# Patient Record
Sex: Female | Born: 1970 | Race: Black or African American | Hispanic: No | Smoking: Never smoker
Health system: Southern US, Community
[De-identification: ages and names within clinical notes are randomized; demographics above are authoritative.]

## PROBLEM LIST (undated history)

## (undated) DIAGNOSIS — N838 Other noninflammatory disorders of ovary, fallopian tube and broad ligament: Secondary | ICD-10-CM

## (undated) HISTORY — DX: Other noninflammatory disorders of ovary, fallopian tube and broad ligament: N83.8

## (undated) HISTORY — PX: TUBAL LIGATION: SHX77

---

## 2002-05-12 ENCOUNTER — Ambulatory Visit (HOSPITAL_COMMUNITY): Admission: RE | Admit: 2002-05-12 | Discharge: 2002-05-12 | Payer: Self-pay | Admitting: Internal Medicine

## 2003-05-02 ENCOUNTER — Emergency Department (HOSPITAL_COMMUNITY): Admission: EM | Admit: 2003-05-02 | Discharge: 2003-05-02 | Payer: Self-pay | Admitting: Emergency Medicine

## 2004-05-05 ENCOUNTER — Ambulatory Visit (HOSPITAL_COMMUNITY): Admission: RE | Admit: 2004-05-05 | Discharge: 2004-05-05 | Payer: Self-pay | Admitting: Family Medicine

## 2006-07-27 ENCOUNTER — Ambulatory Visit (HOSPITAL_COMMUNITY): Admission: RE | Admit: 2006-07-27 | Discharge: 2006-07-27 | Payer: Self-pay | Admitting: Family Medicine

## 2006-08-23 ENCOUNTER — Ambulatory Visit (HOSPITAL_COMMUNITY): Admission: RE | Admit: 2006-08-23 | Discharge: 2006-08-23 | Payer: Self-pay | Admitting: Obstetrics and Gynecology

## 2007-08-29 ENCOUNTER — Ambulatory Visit (HOSPITAL_COMMUNITY): Admission: RE | Admit: 2007-08-29 | Discharge: 2007-08-29 | Payer: Self-pay | Admitting: Family Medicine

## 2009-04-26 ENCOUNTER — Ambulatory Visit (HOSPITAL_COMMUNITY): Admission: RE | Admit: 2009-04-26 | Discharge: 2009-04-26 | Payer: Self-pay | Admitting: Family Medicine

## 2009-07-05 ENCOUNTER — Other Ambulatory Visit: Admission: RE | Admit: 2009-07-05 | Discharge: 2009-07-05 | Payer: Self-pay | Admitting: Obstetrics and Gynecology

## 2010-05-04 ENCOUNTER — Encounter: Payer: Self-pay | Admitting: Family Medicine

## 2010-08-29 NOTE — Op Note (Signed)
NAME:  [name] [name], [name] [name]                           ACCOUNT NO.:  [id number]   MEDICAL RECORD NO.:  [id number]                   PATIENT TYPE:  AMB   LOCATION:  DAY                                  FACILITY:  [facility]   PHYSICIAN:  Lionel December, [name].D.                 DATE OF BIRTH:  [birth year]   DATE OF PROCEDURE:  05/12/2002  DATE OF DISCHARGE:                                 OPERATIVE REPORT   PROCEDURE:  Total colonoscopy.   INDICATIONS FOR PROCEDURE:  [name] is a 40 year old African-American female  who has had four episodes of rectal bleeding recently with a bowel movement.  One occasion, she passed what is described to be a moderate amount of bright  red blood. She had some chronic constipation which was her baseline. Her  bowels generally move every four days. She also has lower abdominal  discomfort. She is undergoing diagnostic colonoscopy. Family history is  negative for CRC but positive for multiple colonic polyps in her mother  starting at age 26.   The procedure was reviewed with the patient and informed consent was  obtained.   PREOP MEDICATIONS:  Demerol 40 mg IV, Versed 3 mg IV in divided dose.   INSTRUMENT:  Olympus video system.   FINDINGS:  The procedure was performed in the endoscopy suite. The patient's  vital signs and O2 saturations were monitored during the procedure and  remained stable. The patient was placed in the left lateral decubitus  position, rectal examination performed. No abnormality  noted on external or  digital exam. The scope was placed in the rectum and advanced under direct  vision in the sigmoid colon and beyond. The prep was excellent. The scope  was passed to the cecum which was identified by the appendiceal orifice and  ileocecal valve. A short segment of the TI was also examined and was normal.  As the scope was withdrawn, the colonic mucosa was carefully examined and  revealed normal vascular pattern throughout. Rectal mucosa was normal.  The  scope was retroflexed to examine the anorectal junction and hemorrhoids were  noted below the dentate line. The endoscope was straightened and withdrawn.  The patient tolerated the procedure well.   FINAL DIAGNOSIS:  Normal colonoscopy and terminal ileoscopy except external  hemorrhoids felt to be a cause of recent episodes of rectal bleeding.   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   RECOMMENDATIONS:  1. High fiber diet.  2.     Citrucel or equivalent one tablespoonful daily.  3. Zelnorm 6 mg p.o. b.i.d.  4. She will return for office visit one month from now.  Lionel December, [name].D.    NR/MEDQ  D:  05/12/2002  T:  05/12/2002  Job:  [id number]   cc:   Hanley Hays. [place], [name].D.  [address]  [place]  [place] [address]  Fax: [phone]

## 2010-08-29 NOTE — Consult Note (Signed)
NAME:  [name], [name]                             ACCOUNT NO.:  [id number]   MEDICAL RECORD NO.:  [id number]                  PATIENT TYPE:   LOCATION:                                       FACILITY:   PHYSICIAN:  [name] [name], M.D.                 DATE OF BIRTH:  30-Aug-1970   DATE OF CONSULTATION:  04/21/2002  DATE OF DISCHARGE:                                   CONSULTATION   REASON FOR CONSULTATION:  Rectal bleeding.   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  She states over the last couple of weeks she had noted bright red  blood mixed in her stools.  On one occasion she went to the bathroom and  noticed a moderate amount of bright red blood in the toilet water.  She has  never seen it on the toilet tissue.  Her bowels move regularly.  She denies  any rectal pain or constipation.  Denies any melena.  Denies any heartburn,  unintentional weight loss, vomiting.  She complains of intermittent nausea  which occurs only occasionally.  She does not require medication for this.  She lost 20 pounds over the last six months intentionally.  She complains of  a dull right upper quadrant abdominal pain which particularly is noticeable  at nighttime when she lies down to go to sleep.  It is not associated with  eating.  She also just has a generalized stomach ache most of the time.  She  states her menstrual cycles are very regular.  Denies any NSAIDS or aspirin.   She has had a CBC, LFTs, MET-7, and TSH which were all normal.   CURRENT MEDICATIONS:  None.   ALLERGIES:  No known drug allergies.   PAST MEDICAL HISTORY:  Negative for chronic illnesses.   PAST SURGICAL HISTORY:  Tubal ligation in 1998.   FAMILY HISTORY:  Mother had thyroid cancer status post a thyroidectomy.  She  also has a history of colonic polyps first diagnosed at age 40.  Father  passed away with  renal failure.  No family history of inflammatory bowel  disease or chronic GI illnesses or colorectal cancer.   SOCIAL HISTORY:  She has been married for 11 years, has three sons.  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  She also works part-time at  [employer].  She has never been a smoker.  Denies any alcohol  use.   REVIEW OF SYSTEMS:  Please see HPI for GI and GENERAL.  CARDIOPULMONARY:  Denies any chest pain or shortness of breath.  GENITOURINARY:  Menstrual  cycles are very regular.  She has minimal cramping associated with them.   PHYSICAL EXAMINATION:  VITAL SIGNS:  Weight 138.75 pounds, height 5 feet 3  inches,  temperature 97.8, blood pressure 100/64, pulse 70.  GENERAL:  Pleasant, well-nourished, well-developed black female in no acute  distress.  SKIN:  Warm and dry, no jaundice.  HEENT:  Pupils equal, round, and reactive to light.  Conjunctivae are pink,  sclerae nonicteric.  Oropharyngeal mucosa moist and pink; no lesions,  erythema, or exudate.  No lymphadenopathy, thyromegaly, or carotid bruits.  CHEST:  Lungs clear to auscultation.  CARDIAC:  Reveals regular rate and rhythm, normal S1, S2.  No murmurs, rubs,  or gallops.  ABDOMEN:  Positive bowel sounds, soft, nondistended.  She has mild  tenderness located throughout the lower abdomen but more so on the right mid  to lower abdomen.  She also has very mild right upper quadrant tenderness to  deep palpation.  No rebound tenderness or guarding.  No organomegaly or  masses.  RECTAL:  Examination deferred to time of colonoscopy.  EXTREMITIES:  No edema.   LABORATORY DATA:  From [name] 30, 2003:  Wbc 4; hemoglobin 13.1;  hematocrit 38.3; platelets 189,000.  Sodium 142, potassium 4.1, BUN 9,  creatinine 0.9, total bilirubin 0.5, alkaline phosphatase 62, SGOT 10, SGPT  11, albumin 4.5. Cholesterol 176, triglycerides 37, HDL 49, LDL 120.  TSH  1.725.   IMPRESSION:  The patient is a pleasant  40 year old lady with two-week  history of intermittent hematochezia.  The amount has been small volume.  She also has diffuse lower abdominal pain as well as right-sided abdominal  pain.  This has been chronic in nature.  I discussed with her today that  hematochezia may be due to benign anorectal source such as hemorrhoids;  however, cannot rule out ulceration, polyps, colorectal cancer, or  inflammatory bowel disease.  She would like to proceed with colonoscopy,  which I feel is indicated.  She also has vague lower abdominal pain as well  as a right upper quadrant pain not associated with eating.  She may have an  element of IBS; will await colonoscopy results prior to further workup.   PLAN:  Colonoscopy in the near future.  Based on findings, will consider  workup of right-sided abdominal pain.  In the interim, if she develops  worsening of her abdominal pain she will let us know.   I would like to thank Dr. Josefine [name] for allowing [place] to take part in the care  of this patient.     [name] [name], P.A.                        [name] [name], M.D.    LL/MEDQ  D:  04/21/2002  T:  04/22/2002  Job:  [id number]   cc:   [name] [name], M.D.  [address]  [place]  [place] [address]  Fax: [phone]

## 2013-01-30 ENCOUNTER — Other Ambulatory Visit: Payer: Self-pay | Admitting: Obstetrics and Gynecology

## 2013-02-13 ENCOUNTER — Ambulatory Visit (INDEPENDENT_AMBULATORY_CARE_PROVIDER_SITE_OTHER): Payer: Self-pay | Admitting: Obstetrics and Gynecology

## 2013-02-13 ENCOUNTER — Other Ambulatory Visit (HOSPITAL_COMMUNITY)
Admission: RE | Admit: 2013-02-13 | Discharge: 2013-02-13 | Disposition: A | Discharge status: Home / Self Care | Payer: Self-pay | Source: Ambulatory Visit | Attending: Obstetrics and Gynecology | Admitting: Obstetrics and Gynecology

## 2013-02-13 ENCOUNTER — Encounter: Payer: Self-pay | Admitting: Obstetrics and Gynecology

## 2013-02-13 VITALS — BP 120/76 | Ht 63.0 in | Wt 145.0 lb

## 2013-02-13 DIAGNOSIS — Z01419 Encounter for gynecological examination (general) (routine) without abnormal findings: Secondary | ICD-10-CM | POA: Insufficient documentation

## 2013-02-13 DIAGNOSIS — Z1151 Encounter for screening for human papillomavirus (HPV): Secondary | ICD-10-CM | POA: Insufficient documentation

## 2013-02-13 DIAGNOSIS — N898 Other specified noninflammatory disorders of vagina: Secondary | ICD-10-CM

## 2013-02-13 DIAGNOSIS — Z113 Encounter for screening for infections with a predominantly sexual mode of transmission: Secondary | ICD-10-CM | POA: Insufficient documentation

## 2013-02-13 DIAGNOSIS — N72 Inflammatory disease of cervix uteri: Secondary | ICD-10-CM

## 2013-02-13 LAB — POCT WET PREP WITH KOH: Yeast Wet Prep HPF POC: NEGATIVE

## 2013-02-13 MED ORDER — METRONIDAZOLE 500 MG PO TABS: 500.0000 mg | ORAL_TABLET | Freq: Two times a day (BID) | ORAL | Status: DC

## 2013-02-13 NOTE — Progress Notes (Signed)
Patient ID: [name] [name], female   DOB: 19-Feb-1971, 42 y.o.   MRN: [id number] Pt here today for annual pap and physical. Pt states that she is having constant severe pain, cramping everyday until she starts her cycle. Pt states that the left ovary hurts worse than the right. Pt states that she has had a light vaginal discharge, treated herself for yeast but no change. Pt wants to have a Transvaginal US to check ovaries, has a history of ovarian mass about 3 years ago. lmp 19 oct 14, x 5d Cramps begin daily upon completion of cycle + thin vag d/c,without odor x 2 months, previously irregular episodes of same for a few days, not responsive to yeast tx., annoying volume discharge.   Assessment:  Cervicitis   Plan:  1. pap smear done, next pap due 3 years 2. return annually or prn 3    Annual mammogram advised Subjective:  [name] [name] is a 42 y.o. female No obstetric history on file. who presents for annual exam. Patient's last menstrual period was 12/30/2012. The patient has complaints today of d/c.  abstinentx 4 yr then 1 yr sexually active single partner.   The following portions of the patient's history were reviewed and updated as appropriate: allergies, current medications, past family history, past medical history, past social history, past surgical history and problem list.  Review of Systems Constitutional: negative Gastrointestinal: negative Genitourinary:   Objective:  BP 120/76  Ht 5\' 3"  (1.6 m)  Wt 145 lb (65.772 kg)  BMI 25.69 kg/m2  LMP 12/30/2012   BMI: Body mass index is 25.69 kg/(m^2).  General Appearance: Alert, appropriate appearance for age. No acute distress HEENT: Grossly normal Neck / Thyroid:  Cardiovascular: RRR; normal S1, S2, no murmur Lungs: CTA bilaterally Back: No CVAT Breast Exam: No dimpling, nipple retraction or discharge. No masses or nodes. and No masses or nodes.No dimpling, nipple retraction or discharge. Gastrointestinal: Soft, non-tender, no  masses or organomegaly Pelvic Exam: Cervix: cervicitis and Cervix is irregular friable with a polypoid fullness at 11:00 that may represent inflammation ovary nabothian cysts. Will further delineate with ultrasound once cervicitis is treated Adnexa: enlarged adnexa, right Uterus: mid-position, irregular enlargement and deviation, right Rectal: good sphincter tone Rectovaginal: normal rectal, no masses Lymphatic Exam: Non-palpable nodes in neck, clavicular, axillary, or inguinal regions Skin: no rash or abnormalities Neurologic: Normal gait and speech, no tremor  Psychiatric: Alert and oriented, appropriate affect.  Urinalysis:normal and Not done  Christin Bach. MD Pgr [phone] 3:41 PM

## 2013-02-17 ENCOUNTER — Other Ambulatory Visit: Payer: Self-pay | Admitting: Obstetrics and Gynecology

## 2013-02-17 DIAGNOSIS — N949 Unspecified condition associated with female genital organs and menstrual cycle: Secondary | ICD-10-CM

## 2013-02-20 ENCOUNTER — Ambulatory Visit (INDEPENDENT_AMBULATORY_CARE_PROVIDER_SITE_OTHER): Payer: Self-pay

## 2013-02-20 DIAGNOSIS — N949 Unspecified condition associated with female genital organs and menstrual cycle: Secondary | ICD-10-CM

## 2013-02-21 ENCOUNTER — Telehealth: Payer: Self-pay | Admitting: *Deleted

## 2013-02-21 NOTE — Progress Notes (Signed)
Left message

## 2013-02-21 NOTE — Telephone Encounter (Signed)
Message copied by Criss Alvine on Tue Feb 21, 2013 10:20 AM ------      Message from: Tilda Burrow      Created: Mon Feb 20, 2013  5:57 PM       Please call , schedule colpo ------

## 2013-02-21 NOTE — Telephone Encounter (Signed)
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 Pt states already has colposcopy appt scheduled 03/07/2013.

## 2013-02-26 ENCOUNTER — Telehealth: Payer: Self-pay | Admitting: Obstetrics and Gynecology

## 2013-02-26 NOTE — Telephone Encounter (Signed)
Message copied by Tilda Burrow on Sun Feb 26, 2013  8:13 PM ------      Message from: Lossie Faes      Created: Mon Feb 20, 2013  2:59 PM      Regarding: follow up with Pt concerning u/s results       Dr. Emelda Fear could you please call patient and discuss ultrasound findings, her cell # is [phone].      Thanks,      Rodney Booze ------

## 2013-02-26 NOTE — Telephone Encounter (Signed)
Patient called, ultrasound results reviewed, and pt questions re: recent Pap + for HPV discussed. Pt has colpo on the 25th.

## 2013-02-26 NOTE — Telephone Encounter (Signed)
Message copied by Tilda Burrow on Sun Feb 26, 2013  8:17 PM ------      Message from: Lossie Faes      Created: Mon Feb 20, 2013  2:59 PM      Regarding: follow up with Pt concerning u/s results       Dr. Emelda Fear could you please call patient and discuss ultrasound findings, her cell # is [phone].      Thanks,      Rodney Booze ------

## 2013-03-07 ENCOUNTER — Encounter: Payer: Self-pay | Admitting: Obstetrics and Gynecology

## 2014-12-13 ENCOUNTER — Other Ambulatory Visit: Payer: Self-pay

## 2014-12-14 LAB — CYTOLOGY - PAP

## 2016-01-06 ENCOUNTER — Other Ambulatory Visit: Payer: Self-pay | Admitting: Obstetrics

## 2019-06-20 ENCOUNTER — Emergency Department (HOSPITAL_COMMUNITY)
Admission: EM | Admit: 2019-06-20 | Discharge: 2019-06-20 | Disposition: A | Discharge status: Home / Self Care | Payer: PRIVATE HEALTH INSURANCE | Attending: Emergency Medicine | Admitting: Emergency Medicine

## 2019-06-20 ENCOUNTER — Emergency Department (HOSPITAL_COMMUNITY): Payer: PRIVATE HEALTH INSURANCE

## 2019-06-20 ENCOUNTER — Encounter (HOSPITAL_COMMUNITY): Payer: Self-pay | Admitting: Emergency Medicine

## 2019-06-20 ENCOUNTER — Other Ambulatory Visit: Payer: Self-pay

## 2019-06-20 DIAGNOSIS — Y9389 Activity, other specified: Secondary | ICD-10-CM | POA: Diagnosis not present

## 2019-06-20 DIAGNOSIS — R1011 Right upper quadrant pain: Secondary | ICD-10-CM | POA: Insufficient documentation

## 2019-06-20 DIAGNOSIS — Y92410 Unspecified street and highway as the place of occurrence of the external cause: Secondary | ICD-10-CM | POA: Insufficient documentation

## 2019-06-20 DIAGNOSIS — Y999 Unspecified external cause status: Secondary | ICD-10-CM | POA: Diagnosis not present

## 2019-06-20 DIAGNOSIS — R109 Unspecified abdominal pain: Secondary | ICD-10-CM

## 2019-06-20 LAB — URINALYSIS, ROUTINE W REFLEX MICROSCOPIC
Bilirubin Urine: NEGATIVE
Glucose, UA: NEGATIVE mg/dL
Hgb urine dipstick: NEGATIVE
Ketones, ur: NEGATIVE mg/dL
Nitrite: NEGATIVE
Protein, ur: NEGATIVE mg/dL
Specific Gravity, Urine: 1.015 (ref 1.005–1.030)
pH: 6 (ref 5.0–8.0)

## 2019-06-20 MED ORDER — IOHEXOL 300 MG/ML  SOLN
100.0000 mL | Freq: Once | INTRAMUSCULAR | Status: AC | PRN
  Administered 2019-06-20: 100 mL via INTRAVENOUS
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
## 2019-06-20 NOTE — Discharge Instructions (Signed)
Please use Tylenol and ibuprofen as well as heat therapy for pain Use gentle movement such as walking and stretching Return if you are having more severe pain or new problems
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
## 2019-06-20 NOTE — ED Notes (Signed)
Patient verbalizes understanding of discharge instructions. Opportunity for questioning and answers were provided. Armband removed by staff, pt discharged from ED.  

## 2020-04-20 ENCOUNTER — Other Ambulatory Visit: Payer: PRIVATE HEALTH INSURANCE
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
## 2022-06-29 ENCOUNTER — Encounter (INDEPENDENT_AMBULATORY_CARE_PROVIDER_SITE_OTHER): Payer: Self-pay | Admitting: *Deleted

## 2022-07-09 ENCOUNTER — Encounter: Payer: Self-pay | Admitting: Physician Assistant

## 2022-07-09 ENCOUNTER — Telehealth (INDEPENDENT_AMBULATORY_CARE_PROVIDER_SITE_OTHER): Payer: Self-pay | Admitting: Gastroenterology

## 2022-07-09 ENCOUNTER — Ambulatory Visit: Payer: PRIVATE HEALTH INSURANCE | Admitting: Physician Assistant

## 2022-07-09 VITALS — BP 129/83 | HR 80 | Ht 63.0 in | Wt 163.0 lb

## 2022-07-09 DIAGNOSIS — Z0000 Encounter for general adult medical examination without abnormal findings: Secondary | ICD-10-CM | POA: Diagnosis not present

## 2022-07-09 DIAGNOSIS — G4700 Insomnia, unspecified: Secondary | ICD-10-CM

## 2022-07-09 DIAGNOSIS — Z8639 Personal history of other endocrine, nutritional and metabolic disease: Secondary | ICD-10-CM | POA: Diagnosis not present

## 2022-07-09 DIAGNOSIS — Z13220 Encounter for screening for lipoid disorders: Secondary | ICD-10-CM

## 2022-07-09 DIAGNOSIS — Z0001 Encounter for general adult medical examination with abnormal findings: Secondary | ICD-10-CM | POA: Diagnosis not present

## 2022-07-09 DIAGNOSIS — Z1159 Encounter for screening for other viral diseases: Secondary | ICD-10-CM

## 2022-07-09 DIAGNOSIS — Z114 Encounter for screening for human immunodeficiency virus [HIV]: Secondary | ICD-10-CM

## 2022-07-09 DIAGNOSIS — N951 Menopausal and female climacteric states: Secondary | ICD-10-CM

## 2022-07-09 NOTE — Patient Instructions (Addendum)
To help with your insomnia, I encourage you to consider working on lowering cortisol levels. Some helpful ways that you can try to see if they offer relief is starting the morning by consuming 30 g of protein within 30 minutes of waking up, do not consume caffeine on an empty stomach and do not consume caffeine after 12 pm, work on reducing your stress throughout the day, and avoiding screens 1 hour prior to bed.  Make sure that your environment to sleep and is dark cool and quiet, you can use noise such as a fan or noise machine if needed but avoid having the television on.  The cooling pad I spoke about is called chili pad  Other very helpful ways to manage menopause symptoms is to make sure you are drinking at least 64 ounces of water a day, and following a low sugar diet.  We will call you with your lab results when they are available.  Kennieth Rad, PA-C Physician Assistant [facility] [email or web address]  Menopause Menopause is the normal time of a woman's life when menstrual periods stop completely. It marks the natural end to a woman's ability to become pregnant. It can be defined as the absence of a menstrual period for 12 months without another medical cause. The transition to menopause (perimenopause) most often happens between the ages of 82 and 26, and can last for many years. During perimenopause, hormone levels change in your body, which can cause symptoms and affect your health. Menopause may increase your risk for: Weakened bones (osteoporosis), which causes fractures. Depression. Hardening and narrowing of the arteries (atherosclerosis), which can cause heart attacks and strokes. What are the causes? This condition is usually caused by a natural change in hormone levels that happens as you get older. The condition may also be caused by changes that are not natural, including: Surgery to remove both ovaries (surgical  menopause). Side effects from some medicines, such as chemotherapy used to treat cancer (chemical menopause). What increases the risk? This condition is more likely to start at an earlier age if you have certain medical conditions or have undergone treatments, including: A tumor of the pituitary gland in the brain. A disease that affects the ovaries and hormones. Certain cancer treatments, such as chemotherapy or hormone therapy, or radiation therapy on the pelvis. Heavy smoking and excessive alcohol use. Family history of early menopause. This condition is also more likely to develop earlier in women who are very thin. What are the signs or symptoms? Symptoms of this condition include: Hot flashes. Irregular menstrual periods. Night sweats. Changes in feelings about sex. This could be a decrease in sex drive or an increased discomfort around your sexuality. Vaginal dryness and thinning of the vaginal walls. This may cause painful sex. Dryness of the skin and development of wrinkles. Headaches. Problems sleeping (insomnia). Mood swings or irritability. Memory problems. Weight gain. Hair growth on the face and chest. Bladder infections or problems with urinating. How is this diagnosed? This condition is diagnosed based on your medical history, a physical exam, your age, your menstrual history, and your symptoms. Hormone tests may also be done. How is this treated? In some cases, no treatment is needed. You and your health care provider should make a decision together about whether treatment is necessary. Treatment will be based on your individual condition and preferences. Treatment for this condition focuses on managing symptoms. Treatment may include: Menopausal hormone therapy (MHT). Medicines to treat specific symptoms or complications. Acupuncture.  Vitamin or herbal supplements. Before starting treatment, make sure to let your health care provider know if you have a personal or  family history of these conditions: Heart disease. Breast cancer. Blood clots. Diabetes. Osteoporosis. Follow these instructions at home: Lifestyle Do not use any products that contain nicotine or tobacco, such as cigarettes, e-cigarettes, and chewing tobacco. If you need help quitting, ask your health care provider. Get at least 30 minutes of physical activity on 5 or more days each week. Avoid alcoholic and caffeinated beverages, as well as spicy foods. This may help prevent hot flashes. Get 7-8 hours of sleep each night. If you have hot flashes, try: Dressing in layers. Avoiding things that may trigger hot flashes, such as spicy food, warm places, or stress. Taking slow, deep breaths when a hot flash starts. Keeping a fan in your home and office. Find ways to manage stress, such as deep breathing, meditation, or journaling. Consider going to group therapy with other women who are having menopause symptoms. Ask your health care provider about recommended group therapy meetings. Eating and drinking  Eat a healthy, balanced diet that contains whole grains, lean protein, low-fat dairy, and plenty of fruits and vegetables. Your health care provider may recommend adding more soy to your diet. Foods that contain soy include tofu, tempeh, and soy milk. Eat plenty of foods that contain calcium and vitamin D for bone health. Items that are rich in calcium include low-fat milk, yogurt, beans, almonds, sardines, broccoli, and kale. Medicines Take over-the-counter and prescription medicines only as told by your health care provider. Talk with your health care provider before starting any herbal supplements. If prescribed, take vitamins and supplements as told by your health care provider. General instructions  Keep track of your menstrual periods, including: When they occur. How heavy they are and how long they last. How much time passes between periods. Keep track of your symptoms, noting  when they start, how often you have them, and how long they last. Use vaginal lubricants or moisturizers to help with vaginal dryness and improve comfort during sex. Keep all follow-up visits. This is important. This includes any group therapy or counseling. Contact a health care provider if: You are still having menstrual periods after age 32. You have pain during sex. You have not had a period for 12 months and you develop vaginal bleeding. Get help right away if you have: Severe depression. Excessive vaginal bleeding. Pain when you urinate. A fast or irregular heartbeat (palpitations). Severe headaches. Abdominal pain or severe indigestion. Summary Menopause is a normal time of life when menstrual periods stop completely. It is usually defined as the absence of a menstrual period for 12 months without another medical cause. The transition to menopause (perimenopause) most often happens between the ages of 66 and 64 and can last for several years. Symptoms can be managed through medicines, lifestyle changes, and complementary therapies such as acupuncture. Eat a balanced diet that is rich in nutrients to promote bone health and heart health and to manage symptoms during menopause. This information is not intended to replace advice given to you by your health care provider. Make sure you discuss any questions you have with your health care provider. Document Revised: 12/29/2019 Document Reviewed: 09/14/2019 Elsevier Patient Education  [place].

## 2022-07-09 NOTE — Progress Notes (Signed)
New Patient Office Visit  Subjective    Patient ID: [name] [name], female    DOB: 01/22/1971  Age: 52 y.o. MRN: [id number]  CC:  Chief Complaint  Patient presents with   Annual Exam    HPI [name] M Filsaime request a wellness exam.  States that she does not currently have a primary care provider due to her is retiring.  States that she is current on her Pap and mammogram, states that she had those in January 2024 at [facility] in [place].  States that she has currently being scheduled for a screening colonoscopy.  Endorses history of vitamin D deficiency.  States that she has been having difficulty sleeping, both falling asleep and staying asleep, states that she wakes up throughout the night due to becoming overheated.  States that her last menstrual cycle was 17 months ago.  States that she does not experience much during the day.  Does endorse that she has been drinking more caffeine recently and eating more sugar.  Says that she has tried taking melatonin without relief.   Outpatient Encounter Medications as of 07/09/2022  Medication Sig   [DISCONTINUED] metroNIDAZOLE (FLAGYL) 500 MG tablet Take 1 tablet (500 mg total) by mouth 2 (two) times daily. (Patient not taking: Reported on 06/20/2019)   No facility-administered encounter medications on file as of 07/09/2022.    Past Medical History:  Diagnosis Date   Ovarian mass     Past Surgical History:  Procedure Laterality Date   TUBAL LIGATION      Family History  Problem Relation Age of Onset   Cancer Mother        liver   Hypertension Mother    Diabetes Mother    Kidney disease Father        renal failure   Diabetes Maternal Grandmother    Diabetes Paternal Grandmother     Social History   Socioeconomic History   Marital status: Divorced    Spouse name: Not on file   Number of children: Not on file   Years of education: Not on file   Highest education level: Not on file  Occupational History   Not on  file  Tobacco Use   Smoking status: Never   Smokeless tobacco: Never  Substance and Sexual Activity   Alcohol use: No   Drug use: No   Sexual activity: Yes    Birth control/protection: Surgical  Other Topics Concern   Not on file  Social History Narrative   Not on file   Social Determinants of Health   Financial Resource Strain: Not on file  Food Insecurity: Not on file  Transportation Needs: Not on file  Physical Activity: Not on file  Stress: Not on file  Social Connections: Not on file  Intimate Partner Violence: Not on file    Review of Systems  Constitutional: Negative.   HENT: Negative.    Eyes: Negative.   Respiratory:  Negative for shortness of breath.   Cardiovascular:  Negative for chest pain.  Gastrointestinal: Negative.   Genitourinary: Negative.   Musculoskeletal: Negative.   Skin: Negative.   Neurological: Negative.   Endo/Heme/Allergies: Negative.   Psychiatric/Behavioral:  Negative for depression. The patient has insomnia. The patient is not nervous/anxious.         Objective    BP 129/83 (BP Location: Left Arm, Patient Position: Sitting, Cuff Size: Large)   Pulse 80   Ht 5\' 3"  (1.6 m)   Wt 163 lb (73.9 kg)  BMI 28.87 kg/m   Physical Exam Vitals and nursing note reviewed.  Constitutional:      Appearance: Normal appearance.  HENT:     Head: Normocephalic and atraumatic.     Right Ear: External ear normal.     Left Ear: External ear normal.     Nose: Nose normal.     Mouth/Throat:     Mouth: Mucous membranes are moist.     Pharynx: Oropharynx is clear.  Eyes:     Extraocular Movements: Extraocular movements intact.     Conjunctiva/sclera: Conjunctivae normal.     Pupils: Pupils are equal, round, and reactive to light.  Cardiovascular:     Rate and Rhythm: Normal rate and regular rhythm.     Pulses: Normal pulses.     Heart sounds: Normal heart sounds.  Pulmonary:     Effort: Pulmonary effort is normal.     Breath sounds: Normal  breath sounds.  Musculoskeletal:        General: Normal range of motion.     Cervical back: Normal range of motion and neck supple.  Skin:    General: Skin is warm and dry.  Neurological:     General: No focal deficit present.     Mental Status: She is alert and oriented to person, place, and time.  Psychiatric:        Mood and Affect: Mood normal.        Behavior: Behavior normal.        Thought Content: Thought content normal.        Judgment: Judgment normal.         Assessment & Plan:   Problem List Items Addressed This Visit   None Visit Diagnoses     Abnormal wellness exam    -  Primary   Relevant Orders   CBC with Differential/Platelet   Comp. Metabolic Panel (12)   Insomnia, unspecified type       Relevant Orders   TSH   Menopausal hot flushes       History of vitamin D deficiency       Relevant Orders   Vitamin D, 25-hydroxy   Screening, lipid       Relevant Orders   Lipid panel   Encounter for HCV screening test for low risk patient       Relevant Orders   HCV Ab w Reflex to Quant PCR   Screening for HIV without presence of risk factors       Relevant Orders   HIV antibody (with reflex)      1. Wellness examination Patient encouraged to use MyChart to find PCP in her area.  Return to mobile unit as needed.  Fasting labs completed today - CBC with Differential/Platelet - Comp. Metabolic Panel (12)  2. Insomnia, unspecified type Patient education given on good sleep hygiene - TSH  3. Menopausal hot flushes   4. History of vitamin D deficiency  - Vitamin D, 25-hydroxy  5. Screening, lipid  - Lipid panel  6. Encounter for HCV screening test for low risk patient  - HCV Ab w Reflex to Quant PCR  7. Screening for HIV without presence of risk factors  - HIV antibody (with reflex)   I have reviewed the patient's medical history (PMH, PSH, Social History, Family History, Medications, and allergies) , and have been updated if relevant. I  spent 30 minutes reviewing chart and  face to face time with patient.    Return if symptoms worsen  or fail to improve.   Loraine Grip Mayers, PA-C

## 2022-07-09 NOTE — Telephone Encounter (Signed)
Who is your primary care physician: Dr.Dondiego  Reasons for the colonoscopy: Screening  Have you had a colonoscopy before?  Yes 2005  Do you have family history of colon cancer? no  Previous colonoscopy with polyps removed? no  Do you have a history colorectal cancer?   no  Are you diabetic? If yes, Type 1 or Type 2?    no  Do you have a prosthetic or mechanical heart valve? no  Do you have a pacemaker/defibrillator?   no  Have you had endocarditis/atrial fibrillation? no  Have you had joint replacement within the last 12 months?  no  Do you tend to be constipated or have to use laxatives? no  Do you have any history of drugs or alchohol?  no  Do you use supplemental oxygen?  no  Have you had a stroke or heart attack within the last 6 months?no  Do you take weight loss medication? no  For female patients: have you had a hysterectomy?  no                                     are you post menopausal?       yes                                            do you still have your menstrual cycle? no      Do you take any blood-thinning medications such as: (aspirin, warfarin, Plavix, Aggrenox)  no  If yes we need the name, milligram, dosage and who is prescribing doctor  No current outpatient medications on file prior to visit.   No current facility-administered medications on file prior to visit.    Allergies  Allergen Reactions   Sulfa Antibiotics      Pharmacy: [facility]  Primary Insurance Name: [employer] number where you can be reached: [phone]

## 2022-07-09 NOTE — Telephone Encounter (Signed)
Any room Thanks 

## 2022-07-10 LAB — LIPID PANEL
Chol/HDL Ratio: 4.6 ratio — ABNORMAL HIGH (ref 0.0–4.4)
Cholesterol, Total: 194 mg/dL (ref 100–199)
HDL: 42 mg/dL (ref >39)
LDL Chol Calc (NIH): 135 mg/dL — ABNORMAL HIGH (ref 0–99)
Triglycerides: 93 mg/dL (ref 0–149)
VLDL Cholesterol Cal: 17 mg/dL (ref 5–40)

## 2022-07-10 LAB — CBC WITH DIFFERENTIAL/PLATELET
Basophils Absolute: 0 10*3/uL (ref 0.0–0.2)
Basos: 1 %
EOS (ABSOLUTE): 0.1 10*3/uL (ref 0.0–0.4)
Eos: 2 %
Hematocrit: 44.1 % (ref 34.0–46.6)
Hemoglobin: 14.9 g/dL (ref 11.1–15.9)
Immature Grans (Abs): 0 10*3/uL (ref 0.0–0.1)
Immature Granulocytes: 0 %
Lymphocytes Absolute: 1.3 10*3/uL (ref 0.7–3.1)
Lymphs: 33 %
MCH: 30.5 pg (ref 26.6–33.0)
MCHC: 33.8 g/dL (ref 31.5–35.7)
MCV: 90 fL (ref 79–97)
Monocytes Absolute: 0.3 10*3/uL (ref 0.1–0.9)
Monocytes: 7 %
Neutrophils Absolute: 2.2 10*3/uL (ref 1.4–7.0)
Neutrophils: 57 %
Platelets: 157 10*3/uL (ref 150–450)
RBC: 4.88 x10E6/uL (ref 3.77–5.28)
RDW: 13 % (ref 11.7–15.4)
WBC: 3.9 10*3/uL (ref 3.4–10.8)

## 2022-07-10 LAB — COMP. METABOLIC PANEL (12)
AST: 19 IU/L (ref 0–40)
Albumin/Globulin Ratio: 1.7 (ref 1.2–2.2)
Albumin: 4.2 g/dL (ref 3.8–4.9)
Alkaline Phosphatase: 88 IU/L (ref 44–121)
BUN/Creatinine Ratio: 17 (ref 9–23)
BUN: 13 mg/dL (ref 6–24)
Bilirubin Total: 0.3 mg/dL (ref 0.0–1.2)
Calcium: 9.3 mg/dL (ref 8.7–10.2)
Chloride: 105 mmol/L (ref 96–106)
Creatinine, Ser: 0.76 mg/dL (ref 0.57–1.00)
Globulin, Total: 2.5 g/dL (ref 1.5–4.5)
Glucose: 81 mg/dL (ref 70–99)
Potassium: 4.2 mmol/L (ref 3.5–5.2)
Sodium: 143 mmol/L (ref 134–144)
Total Protein: 6.7 g/dL (ref 6.0–8.5)
eGFR: 94 mL/min/{1.73_m2} (ref >59)

## 2022-07-10 LAB — VITAMIN D 25 HYDROXY (VIT D DEFICIENCY, FRACTURES): Vit D, 25-Hydroxy: 30.4 ng/mL (ref 30.0–100.0)

## 2022-07-10 LAB — TSH: TSH: 2.17 u[IU]/mL (ref 0.450–4.500)

## 2022-07-10 LAB — HIV ANTIBODY (ROUTINE TESTING W REFLEX): HIV Screen 4th Generation wRfx: NONREACTIVE

## 2022-07-10 LAB — HCV INTERPRETATION

## 2022-07-10 LAB — HCV AB W REFLEX TO QUANT PCR: HCV Ab: NONREACTIVE

## 2022-07-14 ENCOUNTER — Encounter (INDEPENDENT_AMBULATORY_CARE_PROVIDER_SITE_OTHER): Payer: Self-pay | Admitting: *Deleted

## 2022-07-14 MED ORDER — PEG 3350-KCL-NA BICARB-NACL 420 G PO SOLR: 4000.0000 mL | Freq: Once | ORAL | 0 refills | Status: AC

## 2022-07-14 NOTE — Telephone Encounter (Signed)
Referral completed

## 2022-07-14 NOTE — Telephone Encounter (Signed)
Pt contacted and colonoscopy scheduled for 08/04/22. Instructions mailed to patient. Prep sent to pharmacy. Will call pt with pre op date.

## 2022-07-14 NOTE — Addendum Note (Signed)
Addended by: Vicente Males on: 07/14/2022 08:43 AM   Modules accepted: Orders

## 2022-07-22 ENCOUNTER — Telehealth (INDEPENDENT_AMBULATORY_CARE_PROVIDER_SITE_OTHER): Payer: Self-pay | Admitting: Gastroenterology

## 2022-07-22 NOTE — Telephone Encounter (Signed)
Left message for pt to return call to let her know about pre op appt scheduled for 07/31/22 via telephone at 10:30am.

## 2022-07-31 ENCOUNTER — Encounter (HOSPITAL_COMMUNITY): Payer: Self-pay

## 2022-07-31 ENCOUNTER — Encounter (HOSPITAL_COMMUNITY)
Admission: RE | Admit: 2022-07-31 | Discharge: 2022-07-31 | Disposition: A | Discharge status: Home / Self Care | Payer: 59 | Source: Ambulatory Visit | Attending: Gastroenterology | Admitting: Gastroenterology

## 2022-08-04 ENCOUNTER — Encounter (HOSPITAL_COMMUNITY): Payer: Self-pay | Admitting: Gastroenterology

## 2022-08-04 ENCOUNTER — Encounter (INDEPENDENT_AMBULATORY_CARE_PROVIDER_SITE_OTHER): Payer: Self-pay | Admitting: *Deleted

## 2022-08-04 ENCOUNTER — Ambulatory Visit (HOSPITAL_COMMUNITY)
Admission: RE | Admit: 2022-08-04 | Discharge: 2022-08-04 | Disposition: A | Discharge status: Home / Self Care | Payer: PRIVATE HEALTH INSURANCE | Attending: Gastroenterology | Admitting: Gastroenterology

## 2022-08-04 ENCOUNTER — Ambulatory Visit (HOSPITAL_BASED_OUTPATIENT_CLINIC_OR_DEPARTMENT_OTHER): Payer: PRIVATE HEALTH INSURANCE | Admitting: Anesthesiology

## 2022-08-04 ENCOUNTER — Encounter (HOSPITAL_COMMUNITY)
Admission: RE | Disposition: A | Discharge status: Home / Self Care | Payer: Self-pay | Source: Home / Self Care | Attending: Gastroenterology

## 2022-08-04 ENCOUNTER — Ambulatory Visit (HOSPITAL_COMMUNITY): Payer: PRIVATE HEALTH INSURANCE | Admitting: Anesthesiology

## 2022-08-04 DIAGNOSIS — Z1211 Encounter for screening for malignant neoplasm of colon: Secondary | ICD-10-CM

## 2022-08-04 DIAGNOSIS — D122 Benign neoplasm of ascending colon: Secondary | ICD-10-CM | POA: Diagnosis not present

## 2022-08-04 HISTORY — PX: COLONOSCOPY WITH PROPOFOL: SHX5780

## 2022-08-04 LAB — HM COLONOSCOPY

## 2022-08-04 SURGERY — COLONOSCOPY WITH PROPOFOL
Anesthesia: General

## 2022-08-04 MED ORDER — PROPOFOL 10 MG/ML IV BOLUS
INTRAVENOUS | Status: DC | PRN
  Administered 2022-08-04: 60 mg via INTRAVENOUS

## 2022-08-04 MED ORDER — STERILE WATER FOR IRRIGATION IR SOLN
Status: DC | PRN
  Administered 2022-08-04: 100 mL

## 2022-08-04 MED ORDER — LACTATED RINGERS IV SOLN: INTRAVENOUS | Status: DC

## 2022-08-04 MED ORDER — LIDOCAINE HCL 1 % IJ SOLN
INTRAMUSCULAR | Status: DC | PRN
  Administered 2022-08-04: 50 mg via INTRADERMAL

## 2022-08-04 MED ORDER — PROPOFOL 500 MG/50ML IV EMUL
INTRAVENOUS | Status: DC | PRN
  Administered 2022-08-04: 150 ug/kg/min via INTRAVENOUS

## 2022-08-04 NOTE — Anesthesia Preprocedure Evaluation (Signed)
Anesthesia Evaluation  Patient identified by MRN, date of birth, ID band Patient awake    Reviewed: Allergy & Precautions, H&P , NPO status , Patient's Chart, lab work & pertinent test results  Airway Mallampati: II  TM Distance: >3 FB Neck ROM: Full    Dental no notable dental hx. (+) Dental Advisory Given   Pulmonary neg pulmonary ROS   Pulmonary exam normal breath sounds clear to auscultation       Cardiovascular negative cardio ROS Normal cardiovascular exam Rhythm:Regular Rate:Normal     Neuro/Psych negative neurological ROS  negative psych ROS   GI/Hepatic negative GI ROS, Neg liver ROS,,,  Endo/Other  negative endocrine ROS    Renal/GU negative Renal ROS  negative genitourinary   Musculoskeletal negative musculoskeletal ROS (+)    Abdominal   Peds negative pediatric ROS (+)  Hematology negative hematology ROS (+)   Anesthesia Other Findings   Reproductive/Obstetrics negative OB ROS                             Anesthesia Physical Anesthesia Plan  ASA: 1  Anesthesia Plan: General   Post-op Pain Management: Minimal or no pain anticipated   Induction: Intravenous  PONV Risk Score and Plan: 1 and Propofol infusion  Airway Management Planned: Nasal Cannula and Natural Airway  Additional Equipment:   Intra-op Plan:   Post-operative Plan:   Informed Consent: I have reviewed the patients History and Physical, chart, labs and discussed the procedure including the risks, benefits and alternatives for the proposed anesthesia with the patient or authorized representative who has indicated his/her understanding and acceptance.     Dental advisory given  Plan Discussed with: CRNA and Surgeon  Anesthesia Plan Comments:        Anesthesia Quick Evaluation

## 2022-08-04 NOTE — Op Note (Signed)
[facility] Patient Name: [name] [name] Procedure Date: 08/04/2022 10:06 AM MRN: [id number] Date of Birth: 13-Dec-1970 Attending MD: Katrinka Blazing , , [id number] CSN: [id number] Age: 52 Admit Type: Outpatient Procedure:                Colonoscopy Indications:              Screening for colorectal malignant neoplasm Providers:                Katrinka Blazing, Buel Ream. Thomasena Edis RN, RN, Burke Keels, Technician Referring MD:              Medicines:                Monitored Anesthesia Care Complications:            No immediate complications. Estimated Blood Loss:     Estimated blood loss: none. Procedure:                Pre-Anesthesia Assessment:                           - Prior to the procedure, a History and Physical                            was performed, and patient medications, allergies                            and sensitivities were reviewed. The patient's                            tolerance of previous anesthesia was reviewed.                           - The risks and benefits of the procedure and the                            sedation options and risks were discussed with the                            patient. All questions were answered and informed                            consent was obtained.                           - ASA Grade Assessment: I - A normal, healthy                            patient.                           After obtaining informed consent, the colonoscope                            was passed under direct vision. Throughout the  procedure, the patient's blood pressure, pulse, and                            oxygen saturations were monitored continuously. The                            [id number]-HQ190L ([id number]) scope was introduced through                            the anus and advanced to the the cecum, identified                            by appendiceal orifice and ileocecal valve. The                             colonoscopy was performed without difficulty. The                            patient tolerated the procedure well. The quality                            of the bowel preparation was excellent. Scope In: 10:19:44 AM Scope Out: 10:38:07 AM Scope Withdrawal Time: 0 hours 14 minutes 17 seconds  Total Procedure Duration: 0 hours 18 minutes 23 seconds  Findings:      The perianal and digital rectal examinations were normal.      A 5 mm polyp was found in the ascending colon. The polyp was sessile.       The polyp was removed with a cold snare. Resection and retrieval were       complete.      The retroflexed view of the distal rectum and anal verge was normal and       showed no anal or rectal abnormalities. Impression:               - One 5 mm polyp in the ascending colon, removed                            with a cold snare. Resected and retrieved.                           - The distal rectum and anal verge are normal on                            retroflexion view. Moderate Sedation:      Per Anesthesia Care Recommendation:           - Discharge patient to home (ambulatory).                           - Resume previous diet.                           - Await pathology results.                           - Repeat colonoscopy  for surveillance based on                            pathology results. Procedure Code(s):        --- Professional ---                           [phone], Colonoscopy, flexible; with removal of                            tumor(s), polyp(s), or other lesion(s) by snare                            technique Diagnosis Code(s):        --- Professional ---                           Z12.11, Encounter for screening for malignant                            neoplasm of colon                           D12.2, Benign neoplasm of ascending colon CPT copyright 2022 American Medical Association. All rights reserved. The codes documented in this report are  preliminary and upon coder review may  be revised to meet current compliance requirements. Katrinka Blazing, MD Katrinka Blazing,  08/04/2022 10:43:18 AM This report has been signed electronically. Number of Addenda: 0

## 2022-08-04 NOTE — H&P (Signed)
[name] [name] is an 52 y.o. female.   Chief Complaint: Colorectal cancer screening HPI: 52 year old female with past medical history of ovarian mass, coming for screening colonoscopy.  States she had a colonoscopy more than 10 years ago due to rectal bleeding, found to have hemorrhoids, no report is available.  The patient denies having any complaints such as melena, hematochezia, abdominal pain or distention, change in her bowel movement consistency or frequency, no changes in weight recently.  No family history of colorectal cancer.   Past Medical History:  Diagnosis Date   Ovarian mass     Past Surgical History:  Procedure Laterality Date   TUBAL LIGATION      Family History  Problem Relation Age of Onset   Cancer Mother        liver   Hypertension Mother    Diabetes Mother    Kidney disease Father        renal failure   Diabetes Maternal Grandmother    Diabetes Paternal Grandmother    Social History:  reports that she has never smoked. She has never used smokeless tobacco. She reports that she does not drink alcohol and does not use drugs.  Allergies:  Allergies  Allergen Reactions   Sulfa Antibiotics Other (See Comments)    Respiratory Distress   Sulfamethoxazole-Trimethoprim Other (See Comments)    Other Reaction(s): Respiratory Distress, respiratory distress    Medications Prior to Admission  Medication Sig Dispense Refill   Multiple Vitamin (MULTIVITAMIN WITH MINERALS) TABS tablet Take 1 tablet by mouth daily.      No results found for this or any previous visit (from the past 48 hour(s)). No results found.  Review of Systems  All other systems reviewed and are negative.   Blood pressure (!) 128/95, pulse 81, temperature 98 F (36.7 C), resp. rate 15, SpO2 100 %. Physical Exam  GENERAL: The patient is AO x3, in no acute distress. HEENT: Head is normocephalic and atraumatic. EOMI are intact. Mouth is well hydrated and without lesions. NECK: Supple. No  masses LUNGS: Clear to auscultation. No presence of rhonchi/wheezing/rales. Adequate chest expansion HEART: RRR, normal s1 and s2. ABDOMEN: Soft, nontender, no guarding, no peritoneal signs, and nondistended. BS +. No masses. EXTREMITIES: Without any cyanosis, clubbing, rash, lesions or edema. NEUROLOGIC: AOx3, no focal motor deficit. SKIN: no jaundice, no rashes  Assessment/Plan 52 year old female with past medical history of ovarian mass, coming for screening colonoscopy. The patient is at average risk for colorectal cancer.  We will proceed with colonoscopy today.   Dolores Frame, MD 08/04/2022, 10:02 AM

## 2022-08-04 NOTE — Discharge Instructions (Signed)
You are being discharged to home.  Resume your previous diet.  We are waiting for your pathology results.  Your physician has recommended a repeat colonoscopy for surveillance based on pathology results.  

## 2022-08-04 NOTE — Anesthesia Postprocedure Evaluation (Signed)
Anesthesia Post Note  Patient: [name] [name]  Procedure(s) Performed: COLONOSCOPY WITH PROPOFOL  Patient location during evaluation: Short Stay Anesthesia Type: General Level of consciousness: awake and alert Pain management: pain level controlled Vital Signs Assessment: post-procedure vital signs reviewed and stable Respiratory status: spontaneous breathing Cardiovascular status: blood pressure returned to baseline and stable Postop Assessment: no apparent nausea or vomiting Anesthetic complications: no   No notable events documented.   Last Vitals:  Vitals:   08/04/22 0930 08/04/22 1046  BP: (!) 128/95 106/68  Pulse: 81 83  Resp: 15 18  Temp:  36.5 C  SpO2: 100% 99%    Last Pain:  Vitals:   08/04/22 1046  TempSrc: Axillary  PainSc: 0-No pain                 Nellie Pester

## 2022-08-04 NOTE — Transfer of Care (Signed)
Immediate Anesthesia Transfer of Care Note  Patient: [name] [name]  Procedure(s) Performed: COLONOSCOPY WITH PROPOFOL  Patient Location: Short Stay  Anesthesia Type:General  Level of Consciousness: awake  Airway & Oxygen Therapy: Patient Spontanous Breathing  Post-op Assessment: Report given to RN  Post vital signs: Reviewed and stable  Last Vitals:  Vitals Value Taken Time  BP 106/68 08/04/22 1046  Temp 36.5 C 08/04/22 1046  Pulse 83 08/04/22 1046  Resp 18 08/04/22 1046  SpO2 99 % 08/04/22 1046    Last Pain:  Vitals:   08/04/22 1046  TempSrc: Axillary  PainSc: 0-No pain         Complications: No notable events documented.

## 2022-08-05 ENCOUNTER — Encounter: Payer: Self-pay | Admitting: Physician Assistant

## 2022-08-05 LAB — SURGICAL PATHOLOGY

## 2022-08-07 ENCOUNTER — Encounter (HOSPITAL_COMMUNITY): Payer: Self-pay | Admitting: Gastroenterology

## 2024-01-26 ENCOUNTER — Encounter (INDEPENDENT_AMBULATORY_CARE_PROVIDER_SITE_OTHER): Payer: Self-pay | Admitting: Gastroenterology
